# Patient Record
Sex: Female | Born: 1965 | Race: Black or African American | Hispanic: No | Marital: Single | Smoking: Current every day smoker
Health system: Southern US, Community
[De-identification: ages and names within clinical notes are randomized; demographics above are authoritative.]

## PROBLEM LIST (undated history)

## (undated) HISTORY — PX: CHOLECYSTECTOMY: SHX55

---

## 2001-08-01 ENCOUNTER — Encounter: Payer: Self-pay | Admitting: Emergency Medicine

## 2001-08-01 ENCOUNTER — Emergency Department (HOSPITAL_COMMUNITY): Admission: EM | Admit: 2001-08-01 | Discharge: 2001-08-01 | Payer: Self-pay | Admitting: Emergency Medicine

## 2003-05-08 ENCOUNTER — Emergency Department (HOSPITAL_COMMUNITY): Admission: EM | Admit: 2003-05-08 | Discharge: 2003-05-08 | Payer: Self-pay | Admitting: Emergency Medicine

## 2003-06-25 ENCOUNTER — Emergency Department (HOSPITAL_COMMUNITY): Admission: EM | Admit: 2003-06-25 | Discharge: 2003-06-26 | Payer: Self-pay | Admitting: Emergency Medicine

## 2004-03-04 ENCOUNTER — Emergency Department (HOSPITAL_COMMUNITY): Admission: EM | Admit: 2004-03-04 | Discharge: 2004-03-04 | Payer: Self-pay | Admitting: Emergency Medicine

## 2004-03-28 ENCOUNTER — Emergency Department (HOSPITAL_COMMUNITY): Admission: EM | Admit: 2004-03-28 | Discharge: 2004-03-28 | Payer: Self-pay | Admitting: Emergency Medicine

## 2004-04-30 ENCOUNTER — Ambulatory Visit: Payer: Self-pay | Admitting: Family Medicine

## 2004-05-07 ENCOUNTER — Ambulatory Visit: Payer: Self-pay | Admitting: Family Medicine

## 2004-05-07 ENCOUNTER — Other Ambulatory Visit: Admission: RE | Admit: 2004-05-07 | Discharge: 2004-05-07 | Payer: Self-pay | Admitting: Family Medicine

## 2006-01-16 ENCOUNTER — Emergency Department (HOSPITAL_COMMUNITY): Admission: EM | Admit: 2006-01-16 | Discharge: 2006-01-16 | Payer: Self-pay | Admitting: Emergency Medicine

## 2006-04-27 ENCOUNTER — Ambulatory Visit (HOSPITAL_COMMUNITY): Admission: RE | Admit: 2006-04-27 | Discharge: 2006-04-27 | Payer: Self-pay | Admitting: Obstetrics

## 2006-05-28 ENCOUNTER — Emergency Department (HOSPITAL_COMMUNITY): Admission: EM | Admit: 2006-05-28 | Discharge: 2006-05-28 | Payer: Self-pay | Admitting: Family Medicine

## 2006-08-07 ENCOUNTER — Emergency Department (HOSPITAL_COMMUNITY): Admission: EM | Admit: 2006-08-07 | Discharge: 2006-08-07 | Payer: Self-pay | Admitting: Family Medicine

## 2006-11-11 ENCOUNTER — Emergency Department (HOSPITAL_COMMUNITY): Admission: EM | Admit: 2006-11-11 | Discharge: 2006-11-11 | Payer: Self-pay | Admitting: Family Medicine

## 2007-01-22 IMAGING — US US TRANSVAGINAL NON-OB
1 series · 13 of 25 positions shown · non-contrast
Comparison: none

CLINICAL DATA: Back pain.  LMP 04/07/06. 
 TRANSABDOMINAL AND TRANSVAGINAL PELVIC ULTRASOUND:
TECHNIQUE: Both transabdominal and transvaginal ultrasound examinations of the pelvis were performed including evaluation of the uterus, ovaries, adnexal regions, and pelvic cul-de-sac.

[Series 1: us transvaginal non-ob · 0.33mm/px · 13 of 39 slices shown]
[im 1/39]
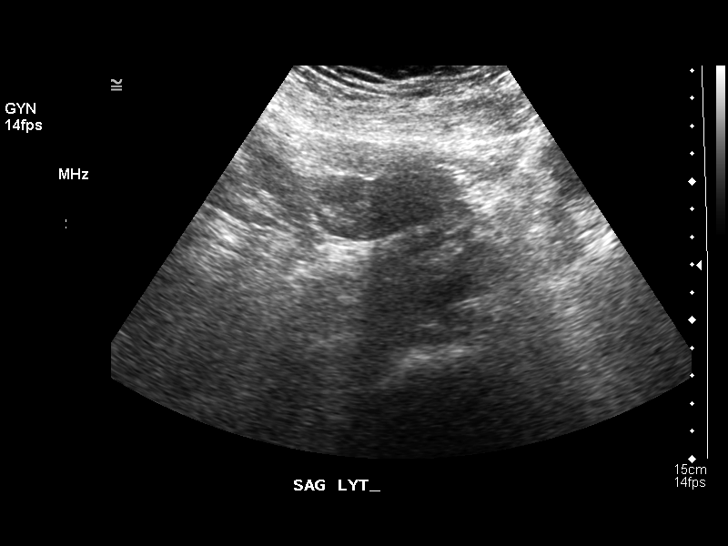
[im 4/39]
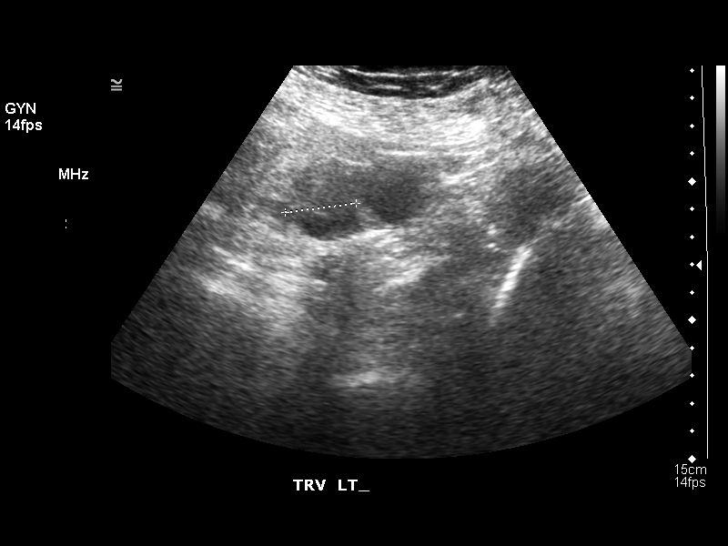
[im 7/39]
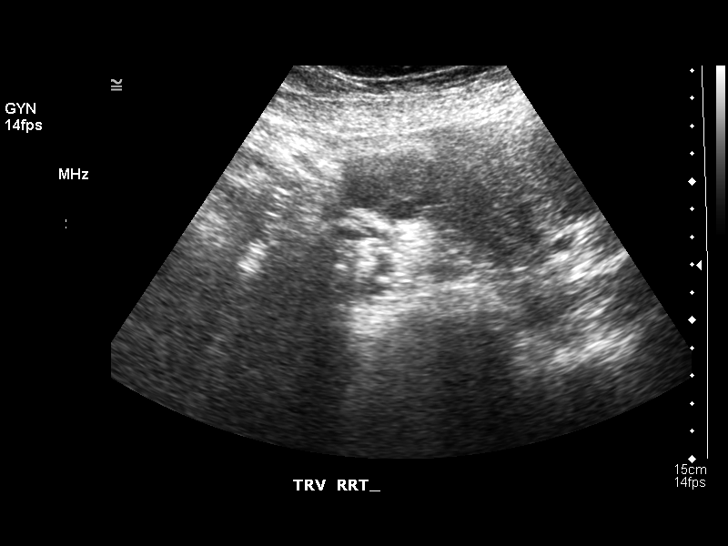
[im 10/39]
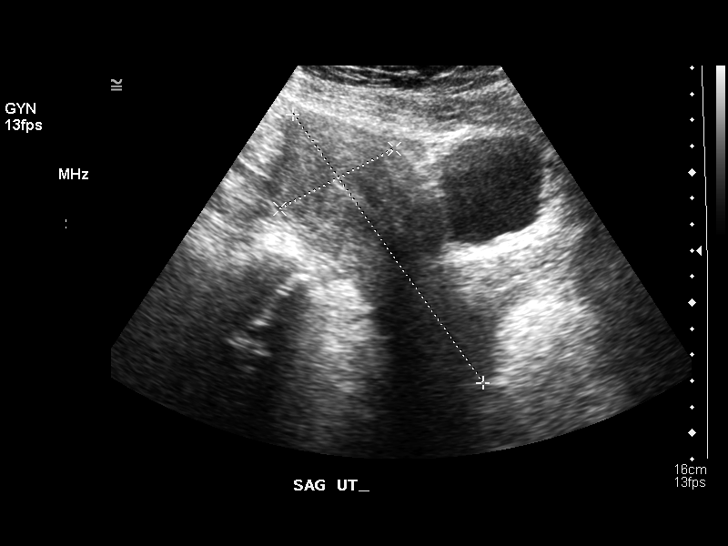
[im 13/39]
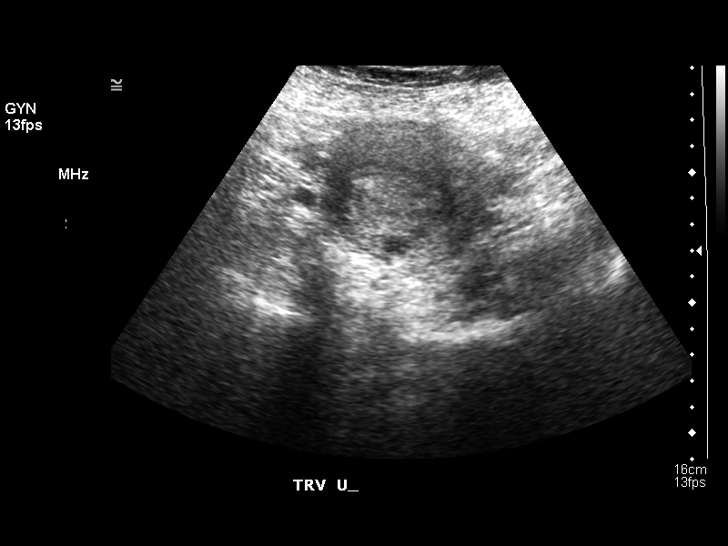
[im 16/39]
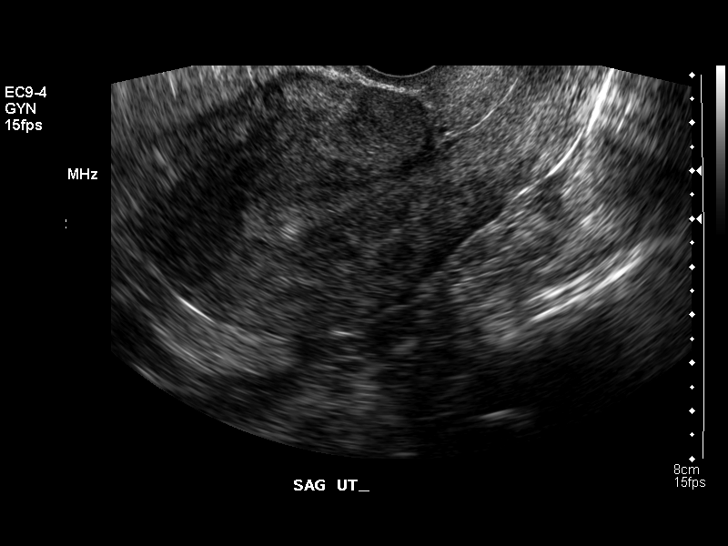
[im 20/39]
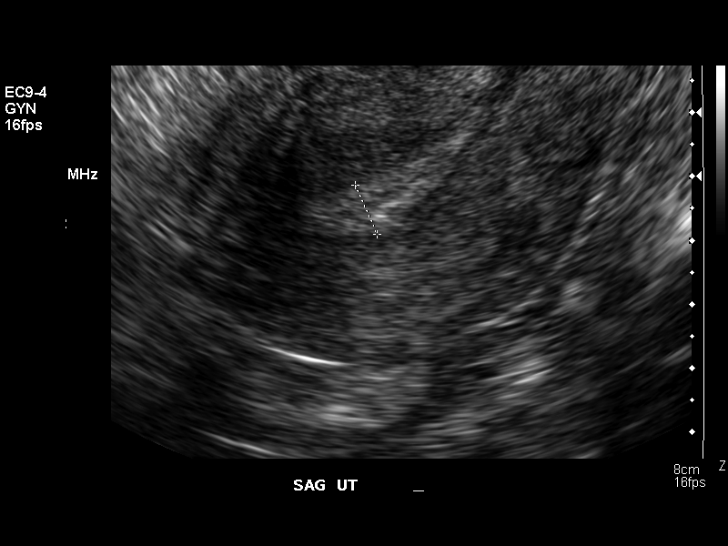
[im 23/39]
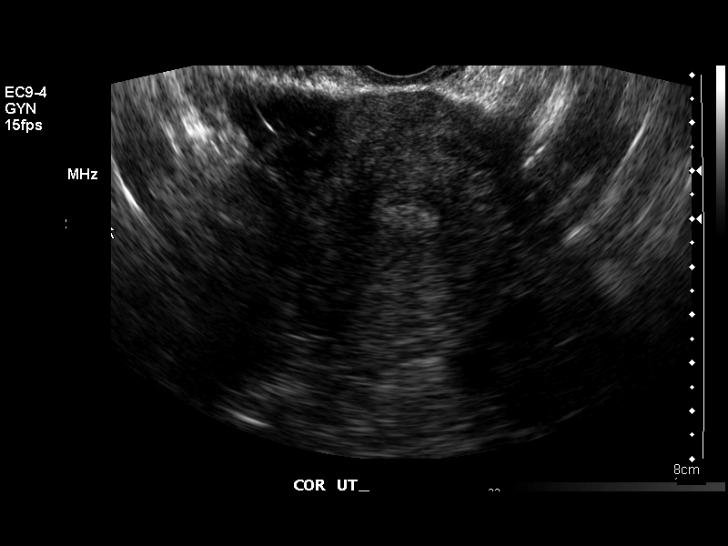
[im 26/39]
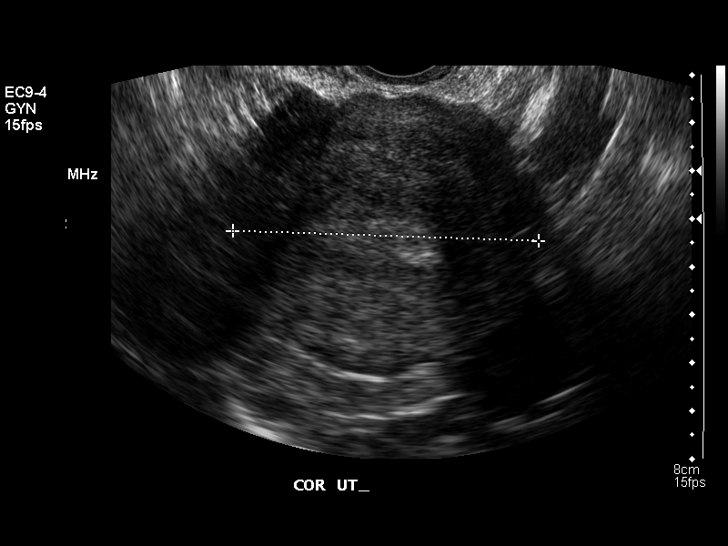
[im 29/39]
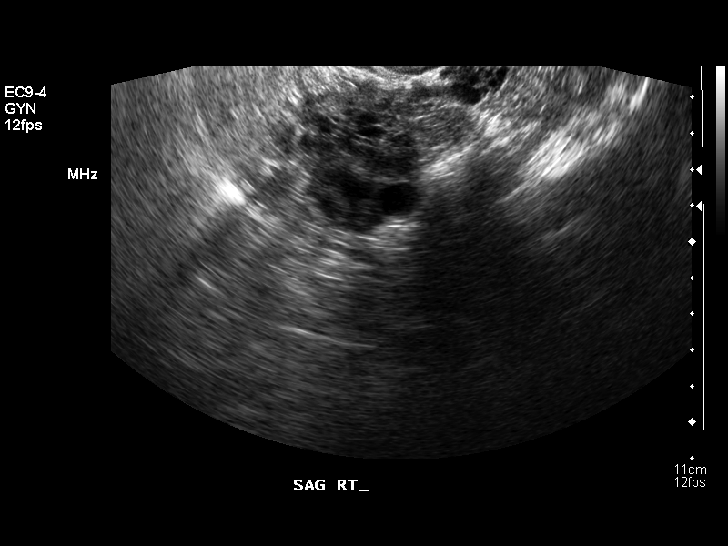
[im 32/39]
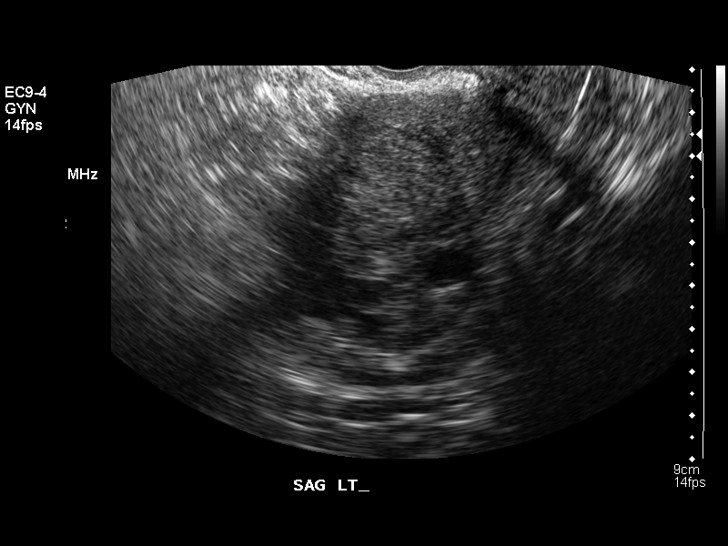
[im 35/39]
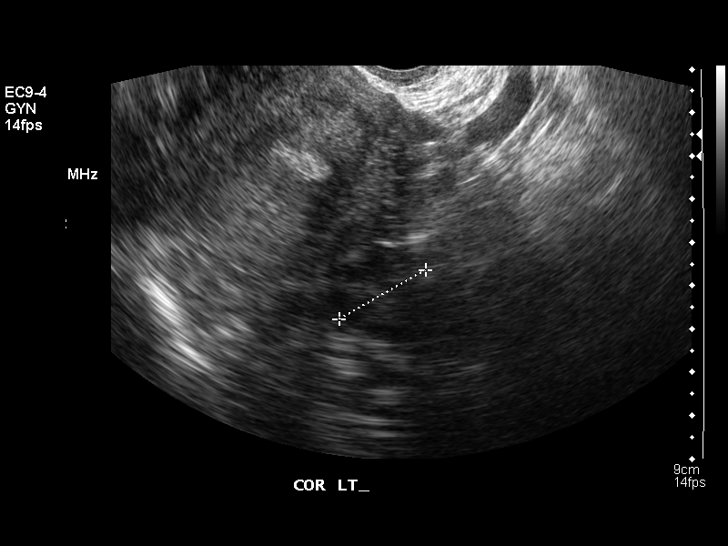
[im 39/39]
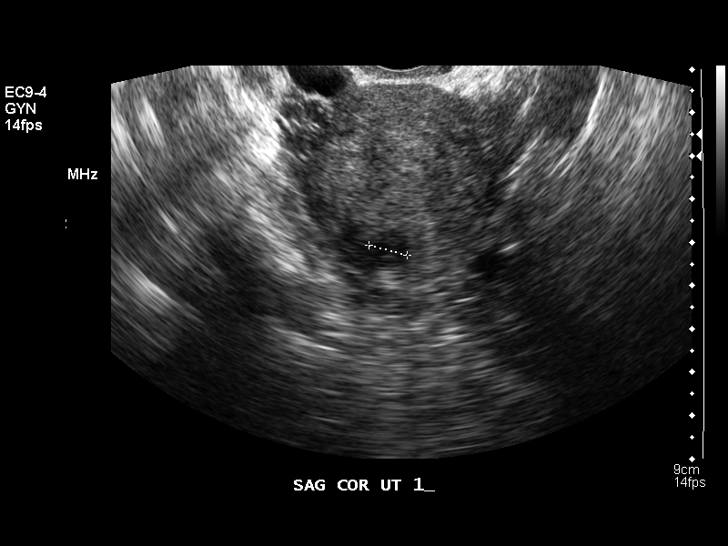

[13 of 25 positions shown; findings below may reference images not displayed]

FINDINGS: Multiple images of the uterus and adnexa were obtained using a transabdominal and endovaginal approach.  The uterus has a sagittal length of 9.5 cm and AP width of 5.3 cm and a transverse width of 6.4 cm.  Noted is a small posterior focal fibroid which is mural and located in the midbody posteriorly measuring 1.1 x .9 x .9 cm.  The remainder of the uterine myometrium is homogeneous.  The endometrial canal is homogeneously echogenic with an AP width of 8.4 mm.  This would correlate with the presecretory endometrial stripe and the patient?s given LMP of 04/07/06.  No areas of focal thickening or inhomogeneity are noted.  Small endometrial abnormalities could be obscured at this point of the cycle.  
 Both ovaries are seen and have a normal appearance with the right ovary measuring 3.1 x 2.0 x 3.6 cm and the left ovary measuring 3.5 x 1.6 x 2.3 cm.  No cul-de-sac or periovarian fluid is seen and no separate adnexal masses are noted.
IMPRESSION: 1.  Small focal fibroid with size and location as noted above.  
 2.  Normal presecretory endometrial stripe. 
 3.  Normal ovaries.

## 2007-05-09 ENCOUNTER — Emergency Department (HOSPITAL_COMMUNITY): Admission: EM | Admit: 2007-05-09 | Discharge: 2007-05-09 | Payer: Self-pay | Admitting: Family Medicine

## 2009-09-06 ENCOUNTER — Ambulatory Visit: Payer: Self-pay | Admitting: Internal Medicine

## 2011-02-20 ENCOUNTER — Emergency Department (HOSPITAL_COMMUNITY)
Admission: EM | Admit: 2011-02-20 | Discharge: 2011-02-20 | Disposition: A | Discharge status: Home / Self Care | Payer: Medicaid Other | Attending: Emergency Medicine | Admitting: Emergency Medicine

## 2011-02-20 DIAGNOSIS — R21 Rash and other nonspecific skin eruption: Secondary | ICD-10-CM | POA: Insufficient documentation

## 2011-02-20 DIAGNOSIS — R000 Tachycardia, unspecified: Secondary | ICD-10-CM | POA: Insufficient documentation

## 2011-09-20 ENCOUNTER — Emergency Department (HOSPITAL_COMMUNITY)
Admission: EM | Admit: 2011-09-20 | Discharge: 2011-09-20 | Disposition: A | Discharge status: Home / Self Care | Payer: Medicaid Other | Source: Home / Self Care | Attending: Emergency Medicine | Admitting: Emergency Medicine

## 2011-09-20 ENCOUNTER — Encounter (HOSPITAL_COMMUNITY): Payer: Self-pay | Admitting: *Deleted

## 2011-09-20 DIAGNOSIS — L600 Ingrowing nail: Secondary | ICD-10-CM

## 2011-09-20 DIAGNOSIS — N760 Acute vaginitis: Secondary | ICD-10-CM

## 2011-09-20 DIAGNOSIS — A499 Bacterial infection, unspecified: Secondary | ICD-10-CM

## 2011-09-20 DIAGNOSIS — B9689 Other specified bacterial agents as the cause of diseases classified elsewhere: Secondary | ICD-10-CM

## 2011-09-20 LAB — POCT URINALYSIS DIP (DEVICE)
Bilirubin Urine: NEGATIVE
Glucose, UA: NEGATIVE mg/dL

## 2011-09-20 LAB — WET PREP, GENITAL

## 2011-09-20 MED ORDER — METRONIDAZOLE 0.75 % VA GEL: 1.0000 | Freq: Two times a day (BID) | VAGINAL | Status: AC

## 2011-09-20 MED ORDER — FLUCONAZOLE 150 MG PO TABS: 150.0000 mg | ORAL_TABLET | Freq: Once | ORAL | Status: AC

## 2011-09-20 MED ORDER — MUPIROCIN 2 % EX OINT: TOPICAL_OINTMENT | Freq: Three times a day (TID) | CUTANEOUS | Status: AC

## 2011-09-20 NOTE — Discharge Instructions (Signed)
Bacterial Vaginosis Bacterial vaginosis (BV) is a vaginal infection where the normal balance of bacteria in the vagina is disrupted. The normal balance is then replaced by an overgrowth of certain bacteria. There are several different kinds of bacteria that can cause BV. BV is the most common vaginal infection in women of childbearing age. CAUSES   The cause of BV is not fully understood. BV develops when there is an increase or imbalance of harmful bacteria.   Some activities or behaviors can upset the normal balance of bacteria in the vagina and put women at increased risk including:   Having a new sex partner or multiple sex partners.   Douching.   Using an intrauterine device (IUD) for contraception.   It is not clear what role sexual activity plays in the development of BV. However, women that have never had sexual intercourse are rarely infected with BV.  Women do not get BV from toilet seats, bedding, swimming pools or from touching objects around them.  SYMPTOMS   Grey vaginal discharge.   A fish-like odor with discharge, especially after sexual intercourse.   Itching or burning of the vagina and vulva.   Burning or pain with urination.   Some women have no signs or symptoms at all.  DIAGNOSIS  Your caregiver must examine the vagina for signs of BV. Your caregiver will perform lab tests and look at the sample of vaginal fluid through a microscope. They will look for bacteria and abnormal cells (clue cells), a pH test higher than 4.5, and a positive amine test all associated with BV.  RISKS AND COMPLICATIONS   Pelvic inflammatory disease (PID).   Infections following gynecology surgery.   Developing HIV.   Developing herpes virus.  TREATMENT  Sometimes BV will clear up without treatment. However, all women with symptoms of BV should be treated to avoid complications, especially if gynecology surgery is planned. Female partners generally do not need to be treated. However,  BV may spread between female sex partners so treatment is helpful in preventing a recurrence of BV.   BV may be treated with antibiotics. The antibiotics come in either pill or vaginal cream forms. Either can be used with nonpregnant or pregnant women, but the recommended dosages differ. These antibiotics are not harmful to the baby.   BV can recur after treatment. If this happens, a second round of antibiotics will often be prescribed.   Treatment is important for pregnant women. If not treated, BV can cause a premature delivery, especially for a pregnant woman who had a premature birth in the past. All pregnant women who have symptoms of BV should be checked and treated.   For chronic reoccurrence of BV, treatment with a type of prescribed gel vaginally twice a week is helpful.  HOME CARE INSTRUCTIONS   Finish all medication as directed by your caregiver.   Do not have sex until treatment is completed.   Tell your sexual partner that you have a vaginal infection. They should see their caregiver and be treated if they have problems, such as a mild rash or itching.   Practice safe sex. Use condoms. Only have 1 sex partner.  PREVENTION  Basic prevention steps can help reduce the risk of upsetting the natural balance of bacteria in the vagina and developing BV:  Do not have sexual intercourse (be abstinent).   Do not douche.   Use all of the medicine prescribed for treatment of BV, even if the signs and symptoms go away.     Tell your sex partner if you have BV. That way, they can be treated, if needed, to prevent reoccurrence.  SEEK MEDICAL CARE IF:   Your symptoms are not improving after 3 days of treatment.   You have increased discharge, pain, or fever.  MAKE SURE YOU:   Understand these instructions.   Will watch your condition.   Will get help right away if you are not doing well or get worse.  FOR MORE INFORMATION  Division of STD Prevention (DSTDP), Centers for Disease  Control and Prevention: SolutionApps.co.za American Social Health Association (ASHA): www.ashastd.org  Document Released: 06/30/2005 Document Revised: 06/19/2011 Document Reviewed: 12/21/2008 [facility] Patient Information 2012 [place], [place].Ingrown Toenail An ingrown toenail occurs when the sharp edge of your toenail grows into the skin. Causes of ingrown toenails include toenails clipped too far back or poorly fitting shoes. Activities involving sudden stops (basketball, tennis) causing "toe jamming" may lead to an ingrown nail. HOME CARE INSTRUCTIONS   Soak the whole foot in warm soapy water for 20 minutes, 3 times per day.   You may lift the edge of the nail away from the sore skin by wedging a small piece of cotton under the corner of the nail. Be careful not to dig (traumatize) and cause more injury to the area.   Wear shoes that fit well. While the ingrown nail is causing problems, sandals may be beneficial.   Trim your toenails regularly and carefully. Cut your toenails straight across, not in a curve. This will prevent injury to the skin at the corners of the toenail.   Keep your feet clean and dry.   Crutches may be helpful early in treatment if walking is painful.   Antibiotics, if prescribed, should be taken as directed.   Return for a wound check in 2 days or as directed.   Only take over-the-counter or prescription medicines for pain, discomfort, or fever as directed by your caregiver.  SEEK IMMEDIATE MEDICAL CARE IF:   You have a fever.   You have increasing pain, redness, swelling, or heat at the wound site.   Your toe is not better in 7 days.  If conservative treatment is not successful, surgical removal of a portion or all of the nail may be necessary. MAKE SURE YOU:   Understand these instructions.   Will watch your condition.   Will get help right away if you are not doing well or get worse.  Document Released: 06/27/2000 Document Revised: 06/19/2011 Document  Reviewed: 06/21/2008 [facility] Patient Information 2012 [place], [place].

## 2011-09-20 NOTE — ED Notes (Signed)
Pt with vaginal discharge x 2 weeks /irritation /denies itching denies urinary symptoms - pt also with c/o left great toe inner toenail painful x one year intermittently

## 2011-09-20 NOTE — ED Provider Notes (Signed)
Chief Complaint  Patient presents with  . Vaginal Discharge    vaginal discharge x 2 weeks - denies urinary symptoms   . Toe Pain    onset of pain left great toe nail x one year intermittently    History of Present Illness:   Marissa Duncan is a 46 year old female who had a two-week history of vaginal discharge with slight itching and some odor. She denies any pelvic pain, lower back pain, fever, chills, nausea, or vomiting. Her last menstrual period was February 23. She is not sexually active. She does have a history of Trichomonas. No history of STDs. She denies any dysuria, frequency, urgency, or blood in the urine.  He also has an ingrown toenail of the right great toe. This been going on for about a year. She tried to dig it out herself with her thumb nail, but is still tender. There is no purulent drainage or granulation tissue.  Review of Systems:  Other than noted above, the patient denies any of the following symptoms: Systemic:  No fever, chills, sweats, fatigue, or weight loss. GI:  No abdominal pain, nausea, anorexia, vomiting, diarrhea, constipation, melena or hematochezia. GU:  No dysuria, frequency, urgency, hematuria, vaginal discharge, itching, or abnormal vaginal bleeding. Skin:  No rash or itching.   PMFSH:  Past medical history, family history, social history, meds, and allergies were reviewed.  Physical Exam:   Vital signs:  BP 107/67  Pulse 74  Temp(Src) 98.8 F (37.1 C) (Oral)  Resp 18  SpO2 100%  LMP 09/06/2011 General:  Alert, oriented and in no distress. Lungs:  Breath sounds clear and equal bilaterally.  No wheezes, rales or rhonchi. Heart:  Regular rhythm.  No gallops or murmers. Abdomen:  Soft, flat and non-distended.  No organomegaly or mass.  No tenderness, guarding or rebound.  Bowel sounds normally active. Pelvic exam:  Normal external genitalia. Vaginal mucosa was normal. There was a moderate amount of malodorous discharge. Cervix was normal. Uterus was  midposition, normal in size and shape and nontender. No adnexal masses or tenderness. No cervical motion tenderness. Extremities: The medial nail fold of the right great toe was tender to touch but was not red or inflamed. There was no purulent drainage or granulation tissue. Skin:  Clear, warm and dry.  Labs:   Results for orders placed during the hospital encounter of 09/20/11  POCT URINALYSIS DIP (DEVICE)      Component Value Range   Glucose, UA NEGATIVE  NEGATIVE (mg/dL)   Bilirubin Urine NEGATIVE  NEGATIVE    Ketones, ur NEGATIVE  NEGATIVE (mg/dL)   Specific Gravity, Urine 1.015  1.005 - 1.030    Hgb urine dipstick SMALL (*) NEGATIVE    pH 6.0  5.0 - 8.0    Protein, ur NEGATIVE  NEGATIVE (mg/dL)   Urobilinogen, UA 0.2  0.0 - 1.0 (mg/dL)   Nitrite NEGATIVE  NEGATIVE    Leukocytes, UA NEGATIVE  NEGATIVE   WET PREP, GENITAL      Component Value Range   Yeast Wet Prep HPF POC NONE SEEN  NONE SEEN    Trich, Wet Prep NONE SEEN  NONE SEEN    Clue Cells Wet Prep HPF POC MODERATE (*) NONE SEEN    WBC, Wet Prep HPF POC MODERATE (*) NONE SEEN      Assessment:   Diagnoses that have been ruled out:  None  Diagnoses that are still under consideration:  None  Final diagnoses:  Bacterial vaginosis  Ingrown toenail  Plan:   1.  The following meds were prescribed:   New Prescriptions   FLUCONAZOLE (DIFLUCAN) 150 MG TABLET    Take 1 tablet (150 mg total) by mouth once.   METRONIDAZOLE (METROGEL) 0.75 % VAGINAL GEL    Place 1 Applicatorful vaginally 2 (two) times daily.   MUPIROCIN OINTMENT (BACTROBAN) 2 %    Apply topically 3 (three) times daily.   2.  The patient was instructed in symptomatic care and handouts were given. 3.  The patient was told to return if becoming worse in any way, if no better in 3 or 4 days, and given some red flag symptoms that would indicate earlier return. She was instructed in symptomatic care of the ingrown toenail. Will try soaking and application of  antibiotic ointment. She did not want nail splitting today. I suggested she come back for nail splitting if no improvement within several weeks.    Reuben Likes, MD 09/20/11 [phone]

## 2011-09-22 NOTE — ED Notes (Signed)
GC/Chlamydia neg., Wet prep: Mod. clue cells, mod. WBC's.  Pt. adequately treated with Metrogel.  Marissa Duncan 09/22/2011

## 2011-11-13 ENCOUNTER — Encounter (HOSPITAL_COMMUNITY): Payer: Self-pay | Admitting: *Deleted

## 2011-11-13 ENCOUNTER — Emergency Department (INDEPENDENT_AMBULATORY_CARE_PROVIDER_SITE_OTHER)
Admission: EM | Admit: 2011-11-13 | Discharge: 2011-11-13 | Disposition: A | Discharge status: Home / Self Care | Payer: Self-pay | Source: Home / Self Care | Attending: Emergency Medicine | Admitting: Emergency Medicine

## 2011-11-13 DIAGNOSIS — N760 Acute vaginitis: Secondary | ICD-10-CM

## 2011-11-13 LAB — POCT PREGNANCY, URINE: Preg Test, Ur: NEGATIVE

## 2011-11-13 LAB — POCT URINALYSIS DIP (DEVICE): Glucose, UA: NEGATIVE mg/dL

## 2011-11-13 LAB — WET PREP, GENITAL

## 2011-11-13 MED ORDER — FLUCONAZOLE 150 MG PO TABS: 150.0000 mg | ORAL_TABLET | Freq: Once | ORAL | Status: AC

## 2011-11-13 NOTE — ED Notes (Signed)
Pt  Seen  3  Weeks   Ago  By  Dr  Lorenz Coaster  And  Was  rx      Flagyl   /  Diflucan      Did  Not start    Flagyl   Until  3  Days  Ago   -   Started  Rash  2  / itch   2  Days  Ago   And  Stopped  Flagyl

## 2011-11-13 NOTE — ED Notes (Signed)
Wet prep: many yeast, few WBC's.  GC/Chlamydia pending. Pt. adequately treated with Diflucan. Vassie Moselle 11/13/2011

## 2011-11-13 NOTE — ED Notes (Signed)
Vitals obtained by students 

## 2011-11-13 NOTE — Discharge Instructions (Signed)
Take this medicine as prescribed we will contact you only if further treatment is needed.

## 2011-11-13 NOTE — ED Provider Notes (Signed)
History     CSN: [id number]  Arrival date & time 11/13/11  1020   First MD Initiated Contact with Patient 11/13/11 1127      Chief Complaint  Patient presents with  . Vaginal Itching    (Consider location/radiation/quality/duration/timing/severity/associated sxs/prior treatment) HPI Comments: i did not start with the flagyl medicine until 2 days ago, since the previous visit, I started itching all over and saw a large whitish and itchy discharge  Patient is a 46 y.o. female presenting with vaginal itching. The history is provided by the patient.  Vaginal Itching This is a new problem. The current episode started 2 days ago. The problem occurs constantly. Pertinent negatives include no abdominal pain.    History reviewed. No pertinent past medical history.  Past Surgical History  Procedure Date  . Cholecystectomy   . Cesarean section     No family history on file.  History  Substance Use Topics  . Smoking status: Current Everyday Smoker  . Smokeless tobacco: Not on file  . Alcohol Use: Yes    OB History    Grav Para Term Preterm Abortions TAB SAB Ect Mult Living                  Review of Systems  Constitutional: Negative for fever, activity change and fatigue.  Gastrointestinal: Negative for abdominal pain.  Musculoskeletal: Negative for arthralgias and gait problem.  Skin: Positive for rash.    Allergies  Review of patient's allergies indicates no known allergies.  Home Medications   Current Outpatient Rx  Name Route Sig Dispense Refill  . FLUCONAZOLE 150 MG PO TABS Oral Take 1 tablet (150 mg total) by mouth once. 3 tablet 0    BP 149/88  Pulse 91  Temp(Src) 98.5 F (36.9 C) (Oral)  Resp 24  SpO2 100%  LMP 10/27/2011  Physical Exam  Nursing note and vitals reviewed. Constitutional: She appears well-developed and well-nourished. She appears distressed.  Eyes: Conjunctivae are normal. Pupils are equal, round, and reactive to light.    Pulmonary/Chest: Effort normal. She has no wheezes.  Abdominal: Soft. There is no tenderness.  Genitourinary:    There is no rash on the right labia. There is no rash on the left labia. There is erythema around the vagina. No bleeding around the vagina. No foreign body around the vagina. Vaginal discharge found.  Skin: No erythema.    ED Course  Procedures (including critical care time)  Labs Reviewed  WET PREP, GENITAL - Abnormal; Notable for the following:    Yeast Wet Prep HPF POC MANY (*)    WBC, Wet Prep HPF POC FEW (*)    All other components within normal limits  POCT URINALYSIS DIP (DEVICE) - Abnormal; Notable for the following:    Hgb urine dipstick MODERATE (*)    Leukocytes, UA TRACE (*) Biochemical Testing Only. Please order routine urinalysis from main lab if confirmatory testing is needed.   All other components within normal limits  POCT PREGNANCY, URINE  GC/CHLAMYDIA PROBE AMP, GENITAL   No results found.   1. Vaginitis and vulvovaginitis       MDM  Patient with external genitalia and pruritus and (discharge) abundant resembled candida.        Marissa Molly, MD 11/13/11 [phone]

## 2011-11-14 LAB — GC/CHLAMYDIA PROBE AMP, GENITAL: GC Probe Amp, Genital: NEGATIVE

## 2011-11-14 NOTE — ED Notes (Signed)
GC/Chlamydia neg. Vassie Moselle 11/14/2011

## 2013-08-03 ENCOUNTER — Ambulatory Visit: Payer: Medicaid Other | Admitting: Obstetrics

## 2016-11-20 ENCOUNTER — Emergency Department (HOSPITAL_COMMUNITY)
Admission: EM | Admit: 2016-11-20 | Discharge: 2016-11-20 | Disposition: A | Discharge status: Home / Self Care | Payer: Self-pay | Attending: Emergency Medicine | Admitting: Emergency Medicine

## 2016-11-20 ENCOUNTER — Emergency Department (HOSPITAL_COMMUNITY): Payer: Self-pay

## 2016-11-20 ENCOUNTER — Encounter (HOSPITAL_COMMUNITY): Payer: Self-pay | Admitting: *Deleted

## 2016-11-20 DIAGNOSIS — R609 Edema, unspecified: Secondary | ICD-10-CM | POA: Insufficient documentation

## 2016-11-20 DIAGNOSIS — M79671 Pain in right foot: Secondary | ICD-10-CM | POA: Insufficient documentation

## 2016-11-20 MED ORDER — NAPROXEN 250 MG PO TABS
500.0000 mg | ORAL_TABLET | Freq: Once | ORAL | Status: AC
  Administered 2016-11-20: 500 mg via ORAL
  Filled 2016-11-20: qty 2

## 2016-11-20 NOTE — ED Provider Notes (Signed)
                                                                                                                Dg Ankle Complete Right  Result Date: 11/20/2016 CLINICAL DATA:  Awoke with right foot and ankle pain and swelling. EXAM: RIGHT ANKLE - COMPLETE 3+ VIEW COMPARISON:  None. FINDINGS: There is no evidence of fracture or dislocation. There is no evidence of inflammatory arthropathy. Peripheral osteophytes at the tibial talar joint. Proliferative changes at the capsular insertion of the talus. There is a prominent plantar calcaneal spur. Mild soft tissue edema is most prominent laterally. Small tibial talar joint effusion. IMPRESSION: Soft tissue edema most prominent laterally, small tibial talar joint effusion. No acute osseous abnormality. Spurring at the tibial talar joint likely degenerative versus sequela of remote traumatic injury. Plantar calcaneal spur. Electronically Signed   By: Rubye OaksMelanie  Ehinger M.D.   On: 11/20/2016 17:55   Radiology results reviewed and shared with patient.  Care instructions and return precautions discussed. Follow-up as indicated in discharge.    Felicie MornSmith, Patrisha Hausmann, NP 11/20/16 Margretta [id number]    Vanetta MuldersZackowski, Scott, MD 11/25/16 [phone]

## 2016-11-20 NOTE — ED Provider Notes (Signed)
[facility]-EMERGENCY DEPT Provider Note    By signing my name below, I, Earmon PhoenixJennifer Waddell, attest that this documentation has been prepared under the direction and in the presence of Francisco Espina, [place]. Electronically Signed: Earmon PhoenixJennifer Waddell, ED Scribe. 11/20/16. 4:04 PM.    History   Chief Complaint Chief Complaint  Patient presents with  . Foot Pain   The history is provided by the patient and medical records. No language interpreter was used.    Marissa Duncan is a 51 y.o. female who presents to the Emergency Department complaining of right foot and ankle pain that began this morning. She reports associated swelling of the areas. She reports her symptoms are midly improving with the ice given to her here, achy, constant 8/10 pain. She states after leaving work yesterday she went to donate plasma. She then walked for a long distance but states that is normal for her. She works in [employer] and is on her feet for long periods of time 6 days a week. She has not taken anything for pain. Bearing weight increases the pain. She states she is able to bear weight with some difficulty.  Ice was applied in triage which she reports has helped to alleviate the pain. She denies trauma, injury or fall. She denies numbness, tingling or weakness of the right foot, toes or leg, fever, chills, nausea, vomiting, bruising or wounds. She denies being bitten by any insects. She denies h/o DM or gout. She does not have a PCP.   History reviewed. No pertinent past medical history.  There are no active problems to display for this patient.   Past Surgical History:  Procedure Laterality Date  . CESAREAN SECTION    . CHOLECYSTECTOMY      OB History    No data available       Home Medications    Prior to Admission medications   Not on File    Family History No family history on file.  Social History Social History  Substance Use Topics  . Smoking status: Current Every Day Smoker  .  Smokeless tobacco: Never Used  . Alcohol use Yes     Comment: OCC     Allergies   Patient has no known allergies.   Review of Systems Review of Systems  Constitutional: Negative for chills and fever.  Gastrointestinal: Negative for nausea and vomiting.  Musculoskeletal: Positive for arthralgias and joint swelling.  Skin: Negative for color change and wound.  Neurological: Negative for weakness and numbness.     Physical Exam Updated Vital Signs BP 96/66 (BP Location: Left Arm)   Pulse 81   Temp 98.9 F (37.2 C) (Oral)   Resp 16   SpO2 96%   Physical Exam  Constitutional: She appears well-developed and well-nourished.  Well appearing  HENT:  Head: Normocephalic and atraumatic.  Nose: Nose normal.  Eyes: Conjunctivae and EOM are normal.  Neck: Normal range of motion.  Cardiovascular: Normal rate and intact distal pulses.   Distal pulses 2+.  Pulmonary/Chest: Effort normal. No respiratory distress.  Normal work of breathing. No respiratory distress noted.   Abdominal: Soft.  Musculoskeletal: She exhibits edema and tenderness. She exhibits no deformity.  Moderate swelling to right ankle especially to lateral malleolus. Moderately TTP. Warmth noted. No erythema noted. No areas of possible infection or wounds. Free ROM.  Neurological: She is alert.  Muscle strength 5/5 against resistance dorsiflexion and plantar flexion. sensation intact   Skin: Skin is warm. Capillary refill takes less  than 2 seconds.  Psychiatric: She has a normal mood and affect. Her behavior is normal.  Nursing note and vitals reviewed.    ED Treatments / Results  DIAGNOSTIC STUDIES: Oxygen Saturation is 96% on RA, adequate by my interpretation.   COORDINATION OF CARE: 4:01  PM- Will prescribe pain medication. Pt verbalizes understanding and agrees to plan.  Medications - No data to display   Labs (all labs ordered are listed, but only abnormal results are displayed) Labs Reviewed - No  data to display  EKG  EKG Interpretation None       Radiology No results found.  Procedures Procedures (including critical care time)  Medications Ordered in ED Medications - No data to display   Initial Impression / Assessment and Plan / ED Course  I have reviewed the triage vital signs and the nursing notes.  Pertinent labs & imaging results that were available during my care of the patient were reviewed by me and considered in my medical decision making (see chart for details).    Patient here with likely overuse right foot causing her pain and swelling. I doubt gout and cellulitis, and patient does not have a history of diabetes. I have low suspicion for fracture-dislocation but will acquire right ankle x-ray due to tenderness on exam. She is in no apparent distress, afebrile, hemodynamically stable.   At shift change care was transferred to Felicie Morn, PA-C who will follow pending studies, re-evaulate and determine disposition.  If negative patient is to get an ace wrap or ASO brace and follow up with podiatry in 1-2 weeks. If positive pt is to given CAM boot with crutches and follow up with podiatry in 1-2 weeks.   Final Clinical Impressions(s) / ED Diagnoses   Final diagnoses:  None    New Prescriptions New Prescriptions   No medications on file    I personally performed the services described in this documentation, which was scribed in my presence. The recorded information has been reviewed and is accurate.    Candie [place], [place] 11/20/16 1714    Vanetta Mulders, MD 11/25/16 [phone]

## 2016-11-20 NOTE — ED Triage Notes (Signed)
Pt states that she woke up with right ankle and foot swelling, pain, and warmth. Pulse present

## 2016-11-20 NOTE — Discharge Instructions (Signed)
Please use ibuprofen or naproxen as needed for pain and swelling. Please rest, ice, compress, elevate your right ankle. Icing your ankle should be for 15-20 minutes 3-5 times a day. Please follow up with podiatry and 1-2 weeks as needed.   Get help right away if: Your foot is numb or tingling. Your foot or toes are swollen. Your foot or toes turn white or blue. You have warmth and redness along your foot.

## 2017-04-16 ENCOUNTER — Emergency Department (HOSPITAL_COMMUNITY)
Admission: EM | Admit: 2017-04-16 | Discharge: 2017-04-16 | Disposition: A | Discharge status: Home / Self Care | Payer: Self-pay | Attending: Emergency Medicine | Admitting: Emergency Medicine

## 2017-04-16 ENCOUNTER — Encounter (HOSPITAL_COMMUNITY): Payer: Self-pay | Admitting: Emergency Medicine

## 2017-04-16 DIAGNOSIS — Z79899 Other long term (current) drug therapy: Secondary | ICD-10-CM | POA: Insufficient documentation

## 2017-04-16 DIAGNOSIS — N898 Other specified noninflammatory disorders of vagina: Secondary | ICD-10-CM | POA: Insufficient documentation

## 2017-04-16 DIAGNOSIS — F17210 Nicotine dependence, cigarettes, uncomplicated: Secondary | ICD-10-CM | POA: Insufficient documentation

## 2017-04-16 DIAGNOSIS — R197 Diarrhea, unspecified: Secondary | ICD-10-CM

## 2017-04-16 DIAGNOSIS — R112 Nausea with vomiting, unspecified: Secondary | ICD-10-CM | POA: Insufficient documentation

## 2017-04-16 DIAGNOSIS — Z5321 Procedure and treatment not carried out due to patient leaving prior to being seen by health care provider: Secondary | ICD-10-CM | POA: Insufficient documentation

## 2017-04-16 LAB — CBC
HEMATOCRIT: 45.1 % (ref 36.0–46.0)
HEMOGLOBIN: 14.9 g/dL (ref 12.0–15.0)
MCH: 30.3 pg (ref 26.0–34.0)
MCHC: 33 g/dL (ref 30.0–36.0)
MCV: 91.7 fL (ref 78.0–100.0)
Platelets: 304 10*3/uL (ref 150–400)
RBC: 4.92 MIL/uL (ref 3.87–5.11)
RDW: 13.1 % (ref 11.5–15.5)
WBC: 7.5 10*3/uL (ref 4.0–10.5)

## 2017-04-16 LAB — COMPREHENSIVE METABOLIC PANEL
ALBUMIN: 2.9 g/dL — AB (ref 3.5–5.0)
ALT: 22 U/L (ref 14–54)
ANION GAP: 6 (ref 5–15)
AST: 24 U/L (ref 15–41)
Alkaline Phosphatase: 54 U/L (ref 38–126)
BUN: 10 mg/dL (ref 6–20)
CALCIUM: 8.2 mg/dL — AB (ref 8.9–10.3)
CO2: 23 mmol/L (ref 22–32)
Chloride: 108 mmol/L (ref 101–111)
Creatinine, Ser: 1.22 mg/dL — ABNORMAL HIGH (ref 0.44–1.00)
GFR calc non Af Amer: 51 mL/min — ABNORMAL LOW (ref >60)
GFR, EST AFRICAN AMERICAN: 59 mL/min — AB (ref >60)
GLUCOSE: 136 mg/dL — AB (ref 65–99)
POTASSIUM: 3.6 mmol/L (ref 3.5–5.1)
SODIUM: 137 mmol/L (ref 135–145)
TOTAL PROTEIN: 4.7 g/dL — AB (ref 6.5–8.1)
Total Bilirubin: 0.6 mg/dL (ref 0.3–1.2)

## 2017-04-16 LAB — LIPASE, BLOOD: Lipase: 26 U/L (ref 11–51)

## 2017-04-16 NOTE — ED Triage Notes (Signed)
Pt to ER for 2 days of diarrhea. States today is better but she needs a work note to clear her to go back to work, works in [employer]

## 2017-04-16 NOTE — Discharge Instructions (Signed)
Return here as needed.  Follow-up with a primary care doctor, increase your fluid intake °

## 2017-04-16 NOTE — ED Triage Notes (Signed)
Per pt, pt complains of vaginal discharge for three days. Pt complains of intense itching. Pt denies any abdominal pain.

## 2017-04-16 NOTE — ED Notes (Signed)
Called for room x2. No answer. 

## 2017-04-16 NOTE — ED Notes (Signed)
Called x 3 with no answer.

## 2017-04-16 NOTE — ED Provider Notes (Signed)
[facility]-EMERGENCY DEPT Provider Note   CSN: [id number] Arrival date & time: 04/16/17  1417     History   Chief Complaint Chief Complaint  Patient presents with  . Diarrhea    HPI Marissa Duncan is a 51 y.o. female.  HPI Patient presents to the emergency department with diarrhea and several episodes of vomiting that occurred 2 days ago.  The patient states she ate some possibly Celontin and soon thereafter started vomiting with diarrhea.  She states that she attempted to get back to work today she was feeling no symptoms and feeling completely better and states that her boss made her come see a doctor since she works in [employer].  Patient states that nothing seems make the condition better or worse.  She states she did take a home remedy of flour mix with water salt-and-pepper which she states helped make her bowel movements solid again. The patient denies chest pain, shortness of breath, headache,blurred vision, neck pain, fever, cough, weakness, numbness, dizziness, anorexia, edema, abdominal pain, nausea, vomiting, diarrhea, rash, back pain, dysuria, hematemesis, bloody stool, near syncope, or syncope. History reviewed. No pertinent past medical history.  There are no active problems to display for this patient.   Past Surgical History:  Procedure Laterality Date  . CESAREAN SECTION    . CHOLECYSTECTOMY      OB History    No data available       Home Medications    Prior to Admission medications   Medication Sig Start Date End Date Taking? Authorizing Provider  Multiple Vitamins-Minerals (CENTRUM SILVER 50+WOMEN) TABS Take 1 tablet by mouth daily.   Yes [provider]    Family History History reviewed. No pertinent family history.  Social History Social History  Substance Use Topics  . Smoking status: Current Every Day Smoker    Packs/day: 0.50    Years: 30.00    Types: Cigarettes  . Smokeless tobacco: Never Used  . Alcohol use 1.8 oz/week    3  Cans of beer per week     Comment: OCC     Allergies   Patient has no known allergies.   Review of Systems Review of Systems All other systems negative except as documented in the HPI. All pertinent positives and negatives as reviewed in the HPI.  Physical Exam Updated Vital Signs BP 112/86 (BP Location: Right Arm)   Pulse 87   Temp (!) 97.4 F (36.3 C) (Oral)   Resp 20   LMP  (LMP Unknown)   SpO2 95%   Physical Exam  Constitutional: She is oriented to person, place, and time. She appears well-developed and well-nourished. No distress.  HENT:  Head: Normocephalic and atraumatic.  Mouth/Throat: Oropharynx is clear and moist.  Eyes: Pupils are equal, round, and reactive to light.  Neck: Normal range of motion. Neck supple.  Cardiovascular: Normal rate, regular rhythm and normal heart sounds.  Exam reveals no gallop and no friction rub.   No murmur heard. Pulmonary/Chest: Effort normal and breath sounds normal. No respiratory distress. She has no wheezes.  Abdominal: Soft. Bowel sounds are normal. She exhibits no distension. There is no tenderness.  Neurological: She is alert and oriented to person, place, and time. She exhibits normal muscle tone. Coordination normal.  Skin: Skin is warm and dry. Capillary refill takes less than 2 seconds. No rash noted. No erythema.  Psychiatric: She has a normal mood and affect. Her behavior is normal.  Nursing note and vitals reviewed.  ED Treatments / Results  Labs (all labs ordered are listed, but only abnormal results are displayed) Labs Reviewed  COMPREHENSIVE METABOLIC PANEL - Abnormal; Notable for the following:       Result Value   Glucose, Bld 136 (*)    Creatinine, Ser 1.22 (*)    Calcium 8.2 (*)    Total Protein 4.7 (*)    Albumin 2.9 (*)    GFR calc non Af Amer 51 (*)    GFR calc Af Amer 59 (*)    All other components within normal limits  LIPASE, BLOOD  CBC  URINALYSIS, ROUTINE W REFLEX MICROSCOPIC    EKG   EKG Interpretation None       Radiology No results found.  Procedures Procedures (including critical care time)  Medications Ordered in ED Medications - No data to display   Initial Impression / Assessment and Plan / ED Course  I have reviewed the triage vital signs and the nursing notes.  Pertinent labs & imaging results that were available during my care of the patient were reviewed by me and considered in my medical decision making (see chart for details).     Patient with prefer to intake oral fluids and would rather not have IV fluids.  I did advise her that she does have a little elevation in her creatinine.  I did advise her to follow-up with her primary care Dr. told to return here as needed.  Patient agrees the plan and all questions were answered.  Patient has not had any symptoms since early yesterday morning.   Final Clinical Impressions(s) / ED Diagnoses   Final diagnoses:  None    New Prescriptions New Prescriptions   No medications on file     Charlestine Night, Cordelia Poche 04/16/17 1731    Charlestine Night, PA-C 04/16/17 1731    Tegeler, Canary Brim, MD 04/16/17 2215

## 2017-08-04 ENCOUNTER — Encounter (HOSPITAL_COMMUNITY): Payer: Self-pay

## 2017-08-04 ENCOUNTER — Other Ambulatory Visit: Payer: Self-pay

## 2017-08-04 ENCOUNTER — Emergency Department (HOSPITAL_COMMUNITY)
Admission: EM | Admit: 2017-08-04 | Discharge: 2017-08-04 | Disposition: A | Discharge status: Home / Self Care | Payer: Medicaid Other | Attending: Emergency Medicine | Admitting: Emergency Medicine

## 2017-08-04 DIAGNOSIS — Z0279 Encounter for issue of other medical certificate: Secondary | ICD-10-CM | POA: Insufficient documentation

## 2017-08-04 DIAGNOSIS — F17210 Nicotine dependence, cigarettes, uncomplicated: Secondary | ICD-10-CM | POA: Insufficient documentation

## 2017-08-04 DIAGNOSIS — R197 Diarrhea, unspecified: Secondary | ICD-10-CM | POA: Insufficient documentation

## 2017-08-04 DIAGNOSIS — R112 Nausea with vomiting, unspecified: Secondary | ICD-10-CM | POA: Insufficient documentation

## 2017-08-04 DIAGNOSIS — Z7689 Persons encountering health services in other specified circumstances: Secondary | ICD-10-CM

## 2017-08-04 NOTE — ED Triage Notes (Signed)
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 Patient states she is no longer having vomiting or diarrhea. Patient states she woks at [employer] and just needs clearance for work.

## 2017-08-04 NOTE — ED Provider Notes (Signed)
[facility]-EMERGENCY DEPT Provider Note   CSN: [id number] Arrival date & time: 08/04/17  1312     History   Chief Complaint Chief Complaint  Patient presents with  . wants to be cleared for work    HPI Marissa Duncan is a 52 y.o. female with a history of tobacco abuse who presents the emergency department today for clearance for work.  Patient states that 4 days ago she had 2 episodes of nonbloody emesis with 2 days worth of nonbloody diarrhea.  No alleviating or aggravating factors.  Patient states she did not have any abdominal pain or other associated symptoms including no dysuria, fevers, chills, or blood in her stool.  Symptoms completely resolved 2 days ago.  At present she is completely asymptomatic and without complaints.  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   HPI  History reviewed. No pertinent past medical history.  There are no active problems to display for this patient.   Past Surgical History:  Procedure Laterality Date  . CESAREAN SECTION    . CHOLECYSTECTOMY      OB History    No data available       Home Medications    Prior to Admission medications   Medication Sig Start Date End Date Taking? Authorizing Provider  Multiple Vitamins-Minerals (CENTRUM SILVER 50+WOMEN) TABS Take 1 tablet by mouth daily.   Yes [provider]    Family History Family History  Problem Relation Age of Onset  . Diabetes Mother   . Seizures Mother     Social History Social History   Tobacco Use  . Smoking status: Current Every Day Smoker    Packs/day: 0.50    Years: 30.00    Pack years: 15.00    Types: Cigarettes  . Smokeless tobacco: Never Used  Substance Use Topics  . Alcohol use: Yes    Alcohol/week: 1.8 oz    Types: 3 Cans of beer per week    Comment: OCC  . Drug use: Yes    Frequency: 2.0 times per week    Types: Marijuana     Allergies   Patient has no known  allergies.   Review of Systems Review of Systems  Constitutional: Negative for chills and fever.  Gastrointestinal: Positive for diarrhea (Previously, resolved at present.), nausea (Previously, resolved at present.) and vomiting (Previously, resolved at present.). Negative for abdominal pain, blood in stool and constipation.  Genitourinary: Negative for dysuria and vaginal bleeding.    Physical Exam Updated Vital Signs BP 119/73 (BP Location: Left Arm)   Pulse 73   Temp 98.1 F (36.7 C) (Oral)   Resp 16   Ht 5' (1.524 m)   Wt 90.7 kg (200 lb)   LMP  (LMP Unknown)   SpO2 96%   BMI 39.06 kg/m   Physical Exam  Constitutional: She appears well-developed and well-nourished. No distress.  HENT:  Head: Normocephalic and atraumatic.  Eyes: Conjunctivae are normal. Right eye exhibits no discharge. Left eye exhibits no discharge.  Cardiovascular: Normal rate and regular rhythm.  No murmur heard. Pulmonary/Chest: Effort normal and breath sounds normal. No respiratory distress.  Abdominal: Soft. She exhibits no distension and no mass. There is no tenderness. There is no rebound and no guarding.  Neurological: She is alert.  Clear speech.   Psychiatric: She has a normal mood and affect. Her behavior is normal. Thought content normal.  Nursing note and vitals reviewed.  ED Treatments / Results  Labs (all labs ordered are listed, but only abnormal results are displayed) Labs Reviewed - No data to display  EKG  EKG Interpretation None       Radiology No results found.  Procedures Procedures (including critical care time)  Medications Ordered in ED Medications - No data to display   Initial Impression / Assessment and Plan / ED Course  I have reviewed the triage vital signs and the nursing notes.  Pertinent labs & imaging results that were available during my care of the patient were reviewed by me and considered in my medical decision making (see chart for  details).  Patient presents for medical clearance after having N/V/D over the weekend which is resolved.  She is nontoxic-appearing, in no apparent distress, vitals are within normal limits.  There was no blood in emesis or diarrhea, there were no associated symptoms including no abdominal pain. Patient is completely asymptomatic at this time without complaints.  She has a benign physical exam.  Discharge home with a note for work.  Discussed reasons to follow-up with primary care and return precautions including but not limited to inability to tolerate fluids, blood in vomit or stool, abdominal pain, fever, or any other concerns..  Provided opportunity for questions, patient confirmed understanding and is in agreement with plan.  Final Clinical Impressions(s) / ED Diagnoses   Final diagnoses:  Return to work evaluation    ED Discharge Orders    None       Cherly Anderson, [place] 08/04/17 1503    Pricilla Loveless, MD 08/05/17 1013

## 2017-08-04 NOTE — Discharge Instructions (Signed)
To the emergency department for any new or worsening symptoms.

## 2017-08-17 IMAGING — DX DG ANKLE COMPLETE 3+V*R*
3 series · 3 of 3 positions shown · non-contrast
Comparison: None.

CLINICAL DATA: Awoke with right foot and ankle pain and swelling.

EXAM:
RIGHT ANKLE - COMPLETE 3+ VIEW

[x ankle ap right]
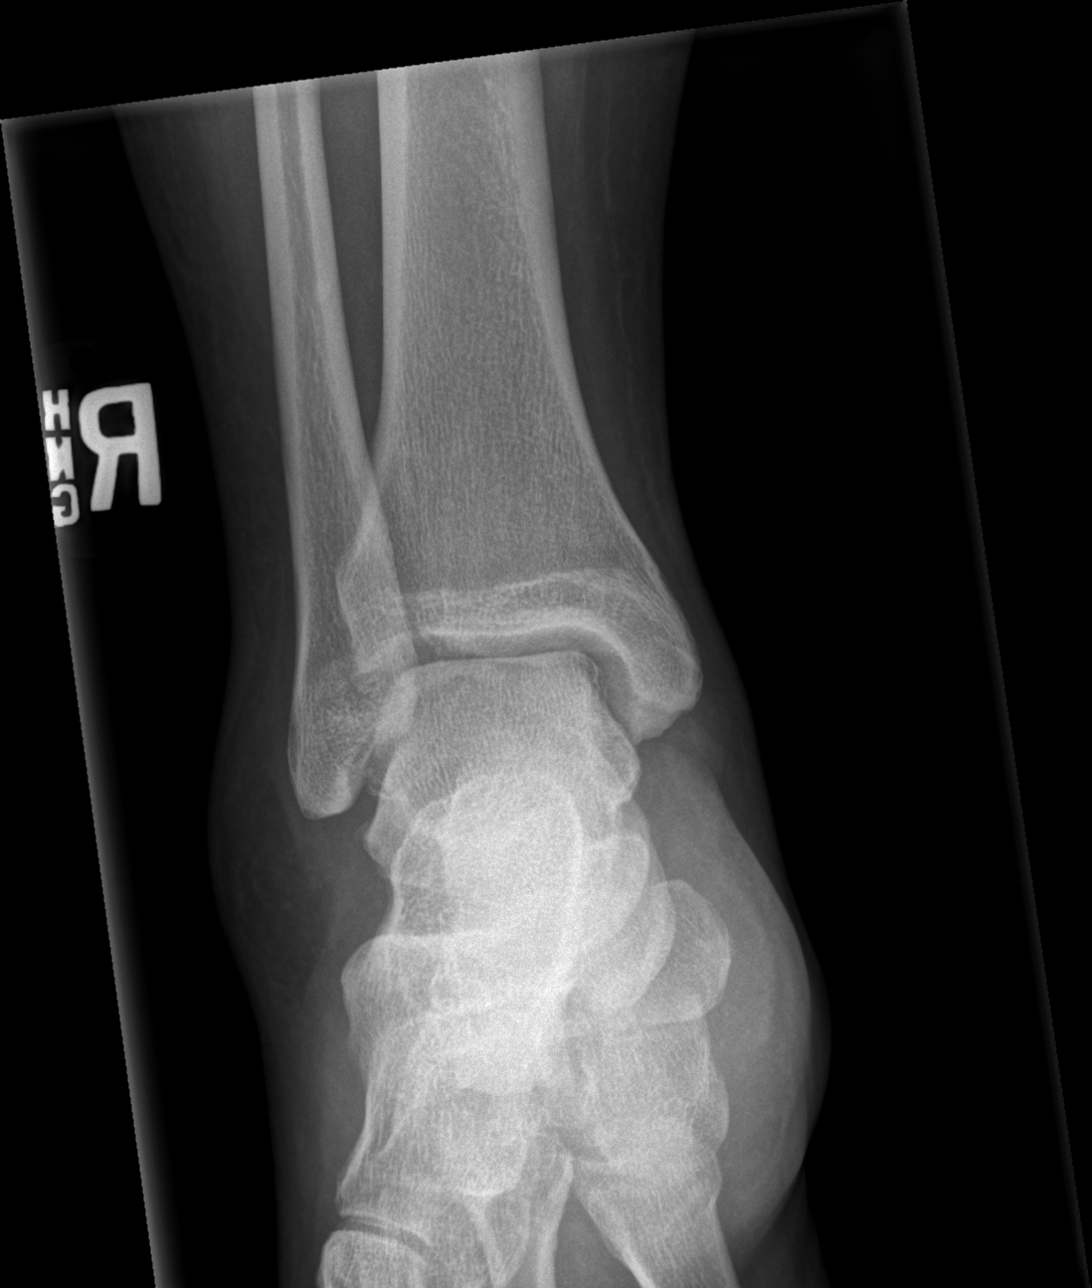

[x ankle obl right]
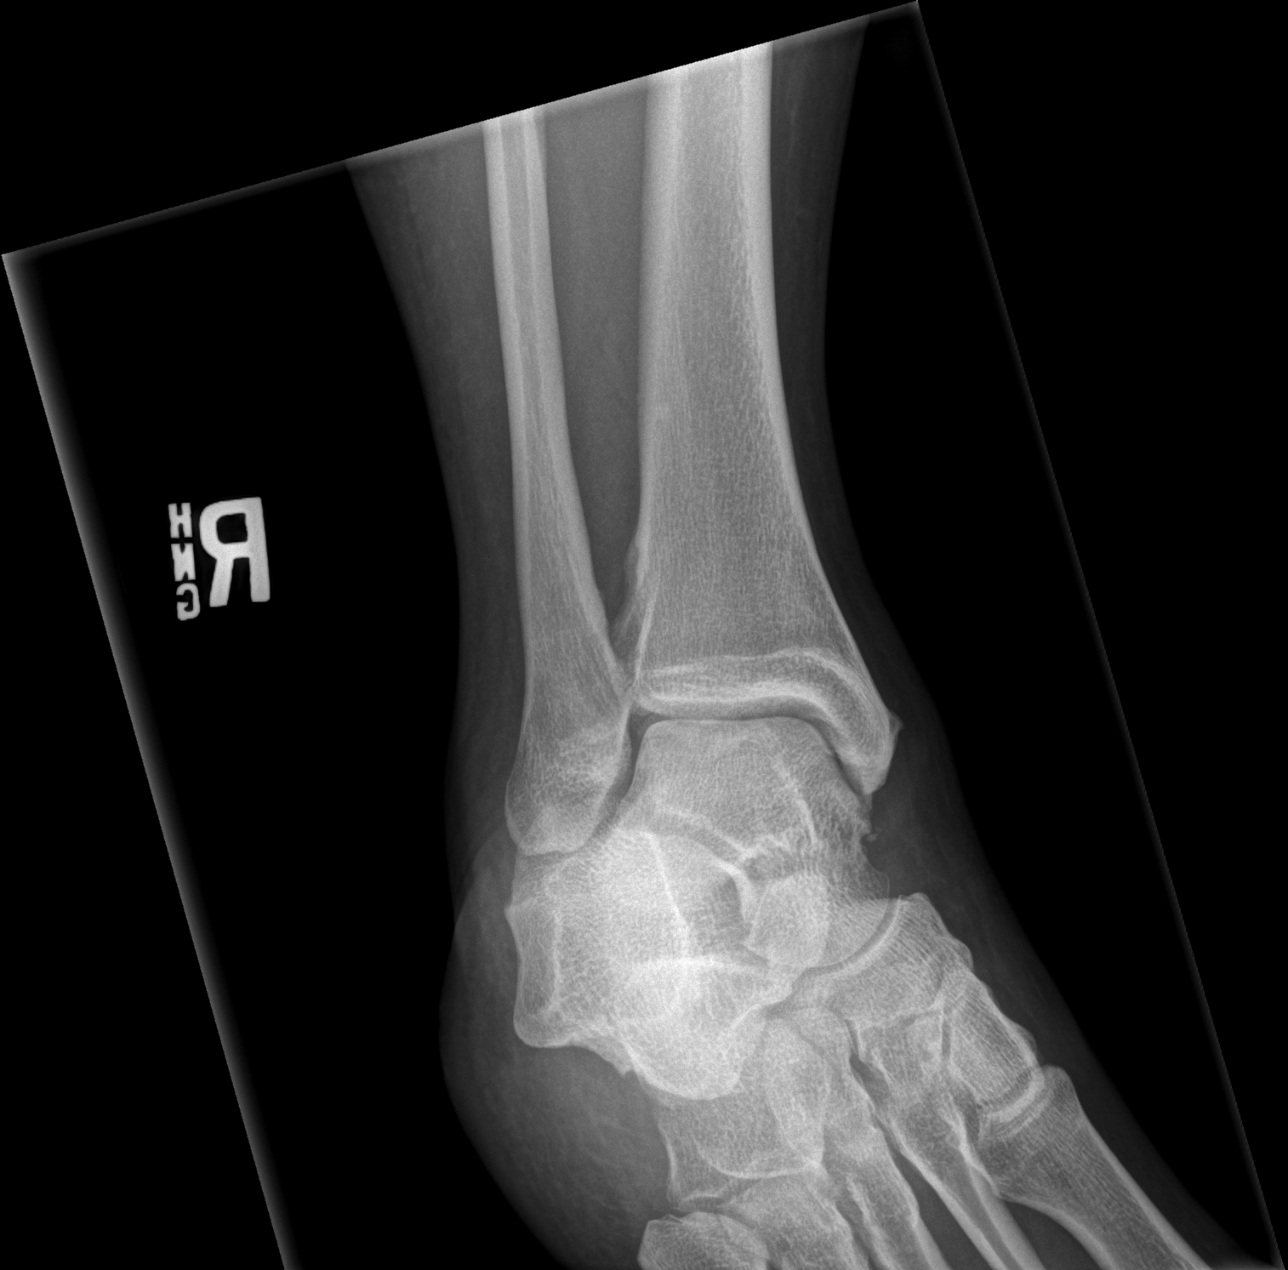

[x ankle lat right]
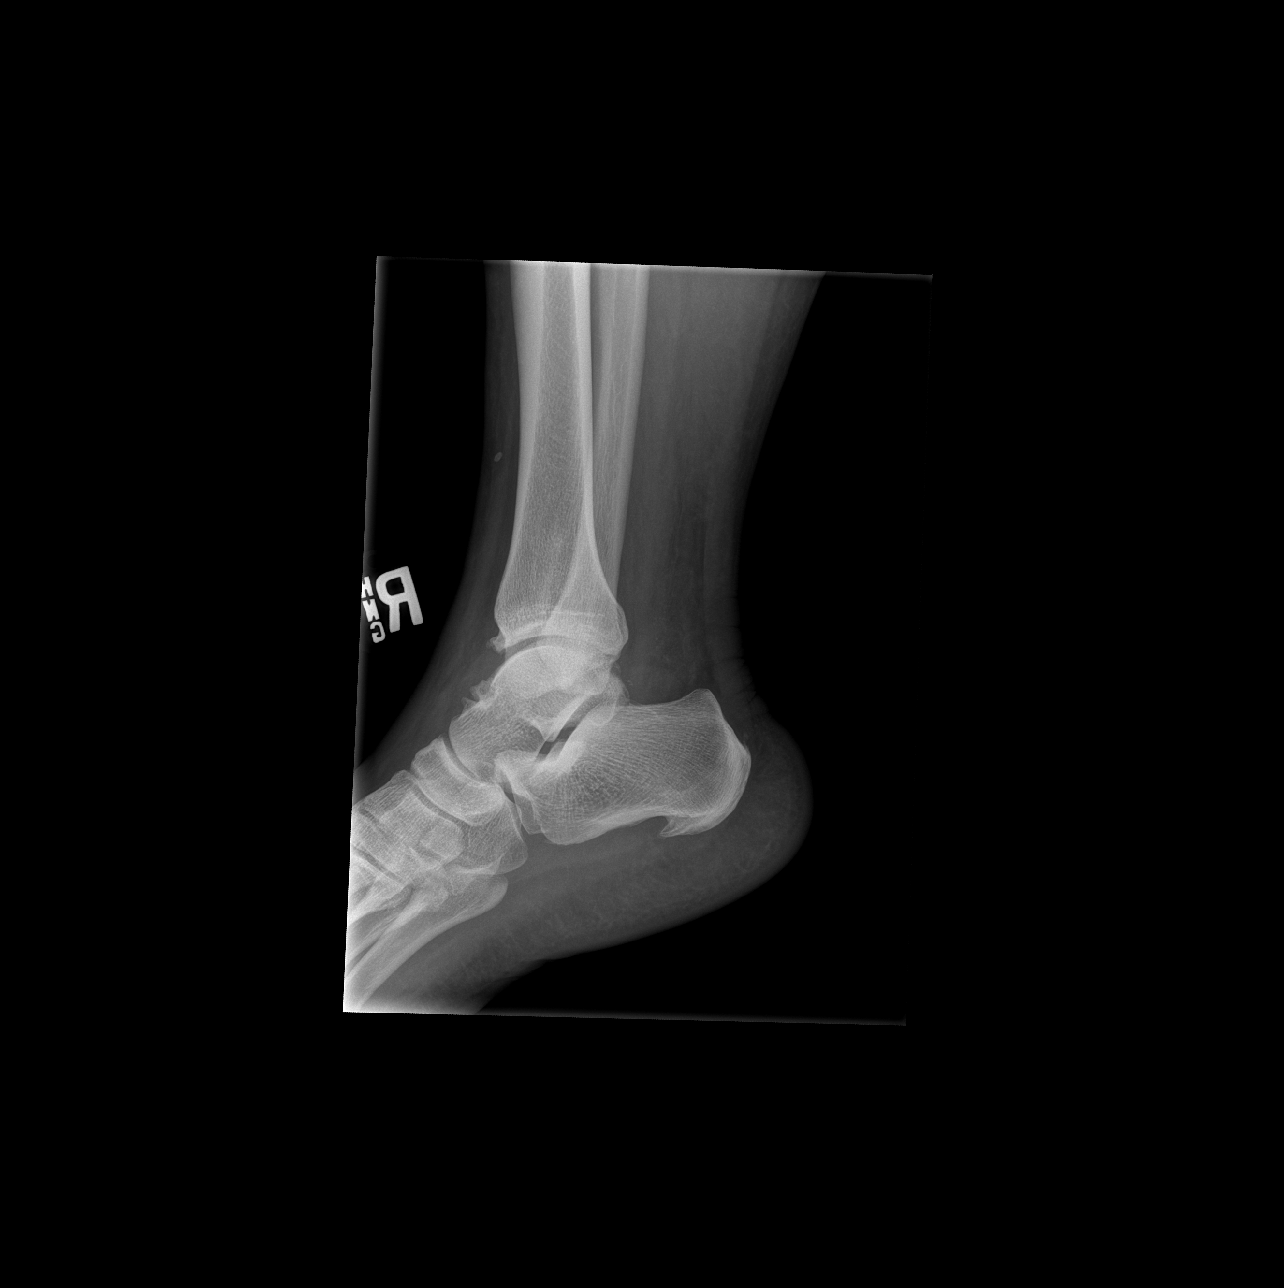

[3 of 3 positions shown; findings below may reference images not displayed]

FINDINGS: There is no evidence of fracture or dislocation. There is no
evidence of inflammatory arthropathy. Peripheral osteophytes at the
tibial talar joint. Proliferative changes at the capsular insertion
of the talus. There is a prominent plantar calcaneal spur. Mild soft
tissue edema is most prominent laterally. Small tibial talar joint
effusion.
IMPRESSION: Soft tissue edema most prominent laterally, small tibial talar joint
effusion. No acute osseous abnormality.

Spurring at the tibial talar joint likely degenerative versus
sequela of remote traumatic injury.

Plantar calcaneal spur.

## 2021-03-09 ENCOUNTER — Emergency Department (HOSPITAL_COMMUNITY)
Admission: EM | Admit: 2021-03-09 | Discharge: 2021-03-09 | Disposition: A | Discharge status: Home / Self Care | Payer: Medicaid Other | Attending: Emergency Medicine | Admitting: Emergency Medicine

## 2021-03-09 ENCOUNTER — Encounter (HOSPITAL_COMMUNITY): Payer: Self-pay | Admitting: Emergency Medicine

## 2021-03-09 ENCOUNTER — Other Ambulatory Visit: Payer: Self-pay

## 2021-03-09 DIAGNOSIS — F17210 Nicotine dependence, cigarettes, uncomplicated: Secondary | ICD-10-CM | POA: Insufficient documentation

## 2021-03-09 DIAGNOSIS — M436 Torticollis: Secondary | ICD-10-CM

## 2021-03-09 MED ORDER — METHOCARBAMOL 500 MG PO TABS
500.0000 mg | ORAL_TABLET | Freq: Once | ORAL | Status: AC
  Administered 2021-03-09: 500 mg via ORAL
  Filled 2021-03-09: qty 1

## 2021-03-09 MED ORDER — IBUPROFEN 200 MG PO TABS
600.0000 mg | ORAL_TABLET | Freq: Once | ORAL | Status: AC
  Administered 2021-03-09: 600 mg via ORAL
  Filled 2021-03-09: qty 3

## 2021-03-09 MED ORDER — METHOCARBAMOL 500 MG PO TABS: 500.0000 mg | ORAL_TABLET | Freq: Two times a day (BID) | ORAL | 0 refills | Status: AC

## 2021-03-09 MED ORDER — IBUPROFEN 600 MG PO TABS: 600.0000 mg | ORAL_TABLET | Freq: Four times a day (QID) | ORAL | 0 refills | Status: AC | PRN

## 2021-03-09 NOTE — ED Notes (Signed)
Pt d/c home per MD order. Discharge summary reviewed with pt, pt verbalizes understanding. No s/s of acute distress noted at discharge.  

## 2021-03-09 NOTE — ED Provider Notes (Signed)
[facility]-EMERGENCY DEPT Provider Note   CSN: [id number] Arrival date & time: 03/09/21  1906     History Chief Complaint  Patient presents with   Neck Pain    Marissa Duncan is a 55 y.o. female who presents to the ED today with complaint of gradual onset, constant, achy, right sided neck pain that began 2 days ago. Pt reports that she woke up with the pain and attributed it to having an old pillow. She swapped pillows without relief. She has not taken anything for pain because she "didn't have anything at home." Pt states her pain is somewhat improved with warm compresses in the ED today. She denies any other symptoms at this time.   The history is provided by the patient and medical records.      History reviewed. No pertinent past medical history.  There are no problems to display for this patient.   Past Surgical History:  Procedure Laterality Date   CESAREAN SECTION     CHOLECYSTECTOMY       OB History   No obstetric history on file.     Family History  Problem Relation Age of Onset   Diabetes Mother    Seizures Mother     Social History   Tobacco Use   Smoking status: Every Day    Packs/day: 0.50    Years: 30.00    Pack years: 15.00    Types: Cigarettes   Smokeless tobacco: Never  Vaping Use   Vaping Use: Never used  Substance Use Topics   Alcohol use: Yes    Alcohol/week: 3.0 standard drinks    Types: 3 Cans of beer per week    Comment: OCC   Drug use: Yes    Frequency: 2.0 times per week    Types: Marijuana    Home Medications Prior to Admission medications   Medication Sig Start Date End Date Taking? Authorizing Provider  ibuprofen (ADVIL) 600 MG tablet Take 1 tablet (600 mg total) by mouth every 6 (six) hours as needed. 03/09/21  Yes Remmy Crass, PA-C  methocarbamol (ROBAXIN) 500 MG tablet Take 1 tablet (500 mg total) by mouth 2 (two) times daily. 03/09/21  Yes Frederic Tones, PA-C  Multiple Vitamins-Minerals  (CENTRUM SILVER 50+WOMEN) TABS Take 1 tablet by mouth daily.    [provider]    Allergies    Patient has no known allergies.  Review of Systems   Review of Systems  Constitutional:  Negative for chills and fever.  Musculoskeletal:  Positive for neck pain. Negative for neck stiffness.  Neurological:  Negative for headaches.  All other systems reviewed and are negative.  Physical Exam Updated Vital Signs BP (!) 156/109 (BP Location: Left Arm)   Pulse 65   Temp 98.3 F (36.8 C) (Oral)   Resp 15   Ht 5' (1.524 m)   Wt 90.7 kg   LMP  (LMP Unknown)   SpO2 98%   BMI 39.06 kg/m   Physical Exam Vitals and nursing note reviewed.  Constitutional:      Appearance: She is not ill-appearing or diaphoretic.  HENT:     Head: Normocephalic and atraumatic.  Eyes:     Conjunctiva/sclera: Conjunctivae normal.  Neck:     Comments: No C midline spinal TTP. + Right sided paracervical and trapezius musculature TTP. ROM limited to the right side s/2 pain. ROM intact elsewhere. Strength 5/5 to BUEs and sensation intact throughout.  Cardiovascular:     Rate and  Rhythm: Normal rate and regular rhythm.     Pulses: Normal pulses.  Pulmonary:     Effort: Pulmonary effort is normal.     Breath sounds: Normal breath sounds. No wheezing, rhonchi or rales.  Skin:    General: Skin is warm and dry.     Coloration: Skin is not jaundiced.  Neurological:     Mental Status: She is alert.    ED Results / Procedures / Treatments   Labs (all labs ordered are listed, but only abnormal results are displayed) Labs Reviewed - No data to display  EKG None  Radiology No results found.  Procedures Procedures   Medications Ordered in ED Medications  ibuprofen (ADVIL) tablet 600 mg (600 mg Oral Given 03/09/21 2108)  methocarbamol (ROBAXIN) tablet 500 mg (500 mg Oral Given 03/09/21 2108)    ED Course  I have reviewed the triage vital signs and the nursing notes.  Pertinent labs & imaging  results that were available during my care of the patient were reviewed by me and considered in my medical decision making (see chart for details).    MDM Rules/Calculators/A&P                           55 year old female who presents to the ED today with complaint of atraumatic right-sided neck pain that began 2 days ago upon waking up.  Has not tried over-the-counter medications for same.  Improvement with warm compresses.  On arrival to the ED vitals are stable patient appears to be in no acute distress.  She is noted to have tenderness palpation to the right paracervical and trapezius area consistent with torticollis.  No midline spinal tenderness.  She has equal strength and sensation to bilateral upper extremities.  Will provide ibuprofen and Robaxin tablet in the ED today and discharged home with same with PCP follow-up.  Patient is currently requesting a sandwich to eat.  Will provide and discharged home.   This note was prepared using Dragon voice recognition software and may include unintentional dictation errors due to the inherent limitations of voice recognition software.   Final Clinical Impression(s) / ED Diagnoses Final diagnoses:  Torticollis, acute    Rx / DC Orders ED Discharge Orders          Ordered    methocarbamol (ROBAXIN) 500 MG tablet  2 times daily        03/09/21 2151    ibuprofen (ADVIL) 600 MG tablet  Every 6 hours PRN        03/09/21 2151             Discharge Instructions      Please pick up medications and take as prescribed Apply warm compresses to the area and lightly massage your neck to help loosen the muscles  Follow up with your PCP regarding ED visit  Return to the ED for any new/worsening symptoms       Tanda Rockers, PA-C 03/09/21 2152    Alvira Monday, MD 03/11/21 2211

## 2021-03-09 NOTE — Discharge Instructions (Addendum)
Please pick up medications and take as prescribed Apply warm compresses to the area and lightly massage your neck to help loosen the muscles  Follow up with your PCP regarding ED visit  Return to the ED for any new/worsening symptoms

## 2021-03-09 NOTE — ED Triage Notes (Signed)
Patient arrives complaining of non-traumatic neck and shoulder pain that she woke up with today. Patient states some relief with use of heat.

## 2021-05-17 ENCOUNTER — Emergency Department (HOSPITAL_COMMUNITY)
Admission: EM | Admit: 2021-05-17 | Discharge: 2021-05-17 | Disposition: A | Discharge status: Home / Self Care | Payer: Medicaid Other | Attending: Emergency Medicine | Admitting: Emergency Medicine

## 2021-05-17 DIAGNOSIS — R197 Diarrhea, unspecified: Secondary | ICD-10-CM | POA: Insufficient documentation

## 2021-05-17 DIAGNOSIS — F17210 Nicotine dependence, cigarettes, uncomplicated: Secondary | ICD-10-CM | POA: Insufficient documentation

## 2021-05-17 NOTE — ED Triage Notes (Signed)
Pt states she needs a work note to return to work

## 2021-05-17 NOTE — ED Provider Notes (Signed)
[facility]-EMERGENCY DEPT Provider Note   CSN: [id number] Arrival date & time: 05/17/21  1821     History No chief complaint on file.   Marissa Duncan is a 55 y.o. female.  HPI  Patient comes to the ED requesting a work note.  She is asymptomatic, had diarrhea acutely on Tuesday.  Symptoms were intermittent and resolved on their own without any intervention.  She go home COVID test which was negative.  Denies any symptoms for the last 2 days.  Requesting note stating that she can return back to work.  Patient brought results to the ED showing she is negative as of today.  Denies alleviating factors or aggravating factors.  No past medical history on file.  There are no problems to display for this patient.   Past Surgical History:  Procedure Laterality Date   CESAREAN SECTION     CHOLECYSTECTOMY       OB History   No obstetric history on file.     Family History  Problem Relation Age of Onset   Diabetes Mother    Seizures Mother     Social History   Tobacco Use   Smoking status: Every Day    Packs/day: 0.50    Years: 30.00    Pack years: 15.00    Types: Cigarettes   Smokeless tobacco: Never  Vaping Use   Vaping Use: Never used  Substance Use Topics   Alcohol use: Yes    Alcohol/week: 3.0 standard drinks    Types: 3 Cans of beer per week    Comment: OCC   Drug use: Yes    Frequency: 2.0 times per week    Types: Marijuana    Home Medications Prior to Admission medications   Medication Sig Start Date End Date Taking? Authorizing Provider  ibuprofen (ADVIL) 600 MG tablet Take 1 tablet (600 mg total) by mouth every 6 (six) hours as needed. 03/09/21   Hyman Hopes, [place], PA-C  methocarbamol (ROBAXIN) 500 MG tablet Take 1 tablet (500 mg total) by mouth 2 (two) times daily. 03/09/21   Tanda Rockers, PA-C  Multiple Vitamins-Minerals (CENTRUM SILVER 50+WOMEN) TABS Take 1 tablet by mouth daily.    [provider]    Allergies     Patient has no known allergies.  Review of Systems   Review of Systems  All other systems reviewed and are negative.  Physical Exam Updated Vital Signs BP (!) 144/89 (BP Location: Right Arm)   Pulse 94   Temp 98.4 F (36.9 C) (Oral)   Resp 18   Ht 5' (1.524 m)   Wt 90.7 kg   LMP  (LMP Unknown)   SpO2 93%   BMI 39.06 kg/m   Physical Exam Vitals and nursing note reviewed. Exam conducted with a chaperone present.  Constitutional:      General: She is not in acute distress.    Appearance: Normal appearance.  HENT:     Head: Normocephalic and atraumatic.  Eyes:     General: No scleral icterus.    Extraocular Movements: Extraocular movements intact.     Pupils: Pupils are equal, round, and reactive to light.  Skin:    Coloration: Skin is not jaundiced.  Neurological:     Mental Status: She is alert. Mental status is at baseline.     Coordination: Coordination normal.    ED Results / Procedures / Treatments   Labs (all labs ordered are listed, but only abnormal results are displayed) Labs Reviewed -  No data to display  EKG None  Radiology No results found.  Procedures Procedures   Medications Ordered in ED Medications - No data to display  ED Course  I have reviewed the triage vital signs and the nursing notes.  Pertinent labs & imaging results that were available during my care of the patient were reviewed by me and considered in my medical decision making (see chart for details).    MDM Rules/Calculators/A&P                           Vitals are stable, patient has no complaints.  Given she is asymptomatic, will discharge patient.  Patient declined redoing a COVID test here in the ED setting.  She is discharged in stable position.  Final Clinical Impression(s) / ED Diagnoses Final diagnoses:  None    Rx / DC Orders ED Discharge Orders     None        Theron Arista, Cordelia Poche 05/17/21 1924    Charlynne Pander, MD 05/20/21 [phone]

## 2021-05-17 NOTE — Discharge Instructions (Signed)
Return if worse

## 2024-05-20 ENCOUNTER — Telehealth: Payer: Self-pay

## 2024-05-20 NOTE — Telephone Encounter (Signed)
 Telephoned patient at mobile number. Left patient a voice message with [facility] (referral) scheduling contact information. Mailed patient a mammogram scholarship application.

## 2024-06-24 ENCOUNTER — Other Ambulatory Visit: Payer: Self-pay | Admitting: Family

## 2024-06-24 DIAGNOSIS — Z1231 Encounter for screening mammogram for malignant neoplasm of breast: Secondary | ICD-10-CM
